# Patient Record
Sex: Male | Born: 1980 | Marital: Married | Smoking: Never smoker
Health system: Southern US, Community
[De-identification: ages and names within clinical notes are randomized; demographics above are authoritative.]

## PROBLEM LIST (undated history)

## (undated) DIAGNOSIS — E785 Hyperlipidemia, unspecified: Secondary | ICD-10-CM

## (undated) DIAGNOSIS — T7840XA Allergy, unspecified, initial encounter: Secondary | ICD-10-CM

## (undated) HISTORY — PX: FRACTURE SURGERY: SHX138

## (undated) HISTORY — DX: Allergy, unspecified, initial encounter: T78.40XA

## (undated) HISTORY — DX: Hyperlipidemia, unspecified: E78.5

---

## 2016-04-30 ENCOUNTER — Other Ambulatory Visit: Payer: Self-pay | Admitting: Family Medicine

## 2016-04-30 DIAGNOSIS — R17 Unspecified jaundice: Secondary | ICD-10-CM

## 2016-05-06 ENCOUNTER — Ambulatory Visit
Admission: RE | Admit: 2016-05-06 | Discharge: 2016-05-06 | Disposition: A | Discharge status: Home / Self Care | Payer: BLUE CROSS/BLUE SHIELD | Source: Ambulatory Visit | Attending: Family Medicine | Admitting: Family Medicine

## 2016-05-06 DIAGNOSIS — R17 Unspecified jaundice: Secondary | ICD-10-CM

## 2017-08-20 IMAGING — US US ABDOMEN LIMITED
1 series · 14 of 25 positions shown · non-contrast
Comparison: None.

CLINICAL DATA: Elevated bilirubin

EXAM:
US ABDOMEN LIMITED - RIGHT UPPER QUADRANT

[Series 1: us abdomen limited · 0.22mm/px · 14 of 50 slices shown]
[im 1/50]
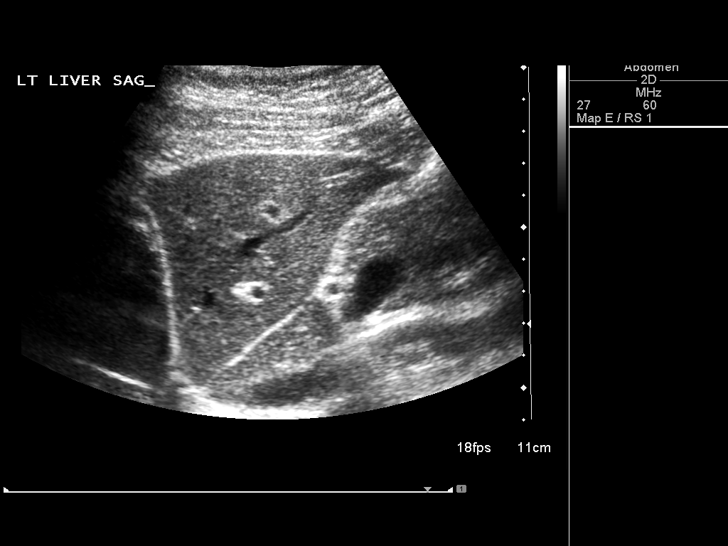
[im 5/50]
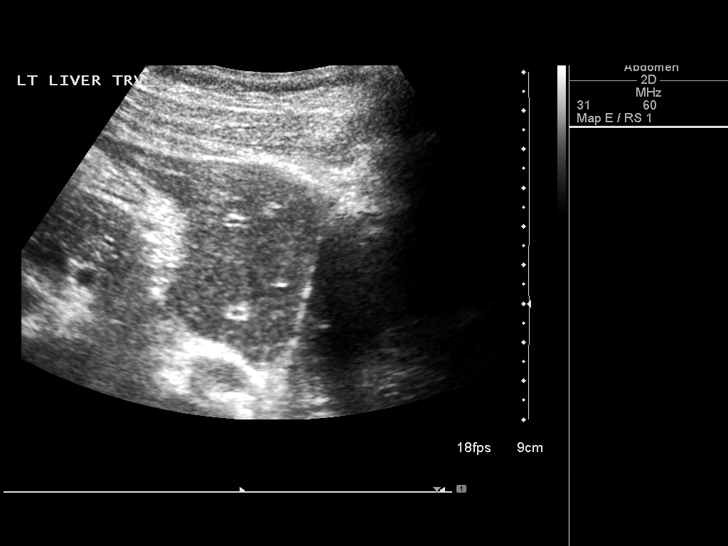
[im 9/50]
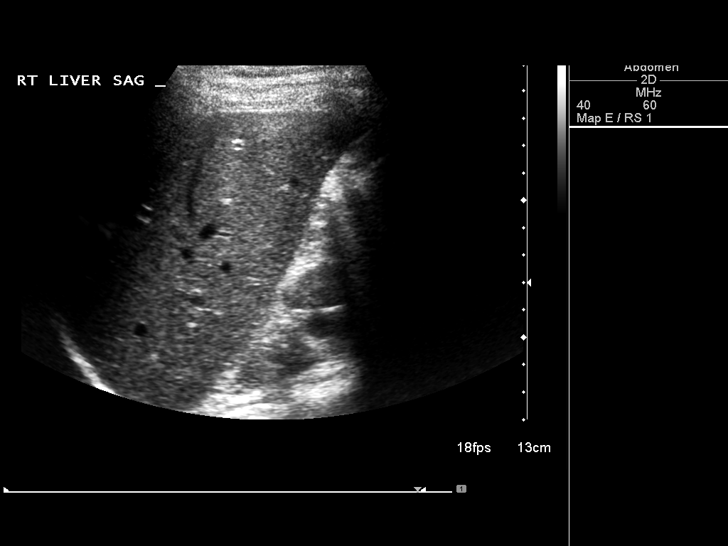
[im 13/50]
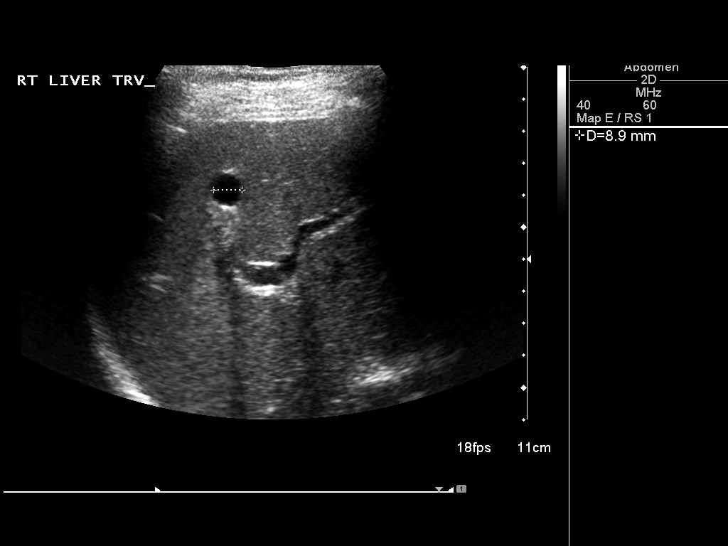
[im 17/50]
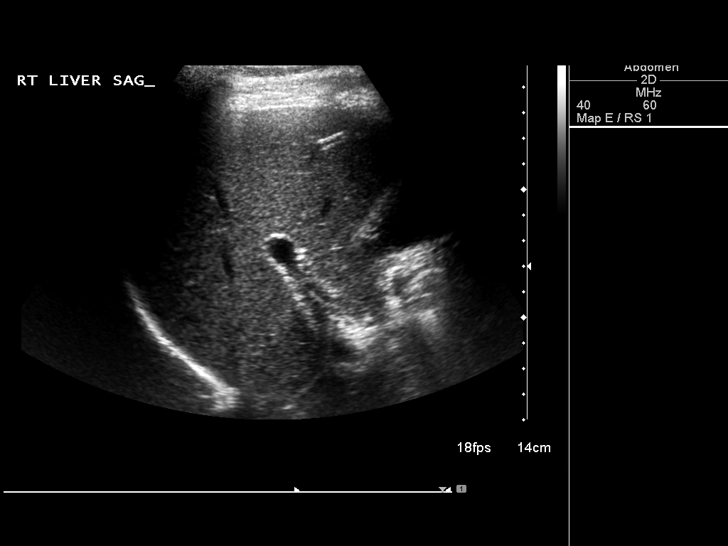
[im 19/50]
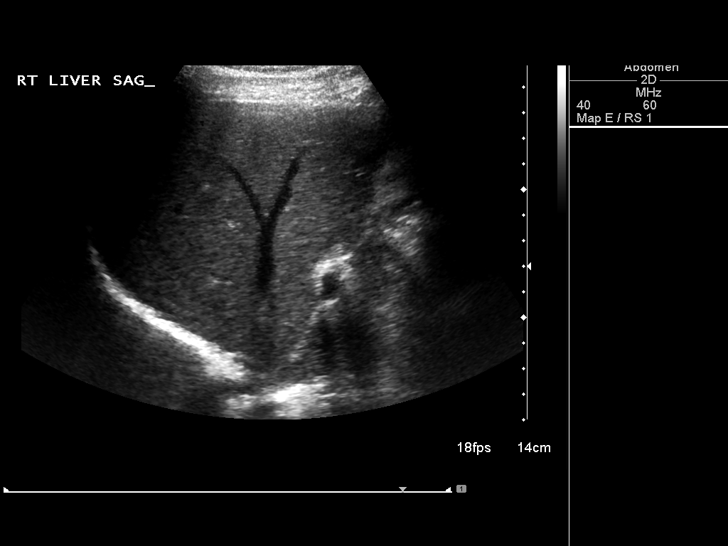
[im 23/50]
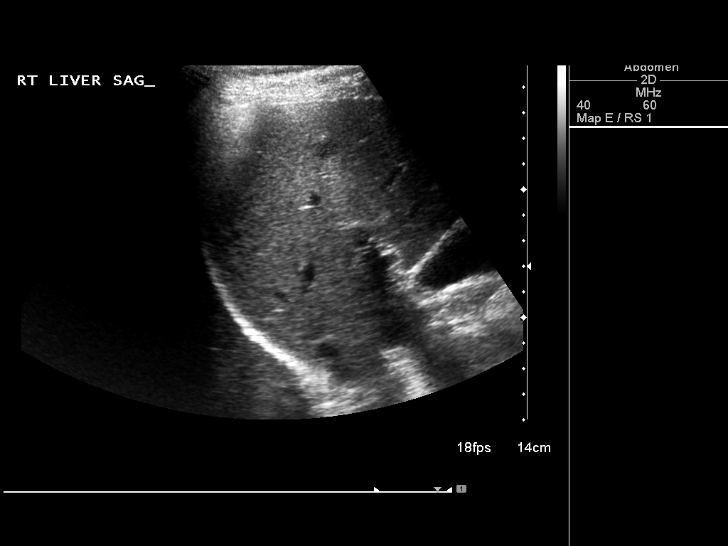
[im 27/50]
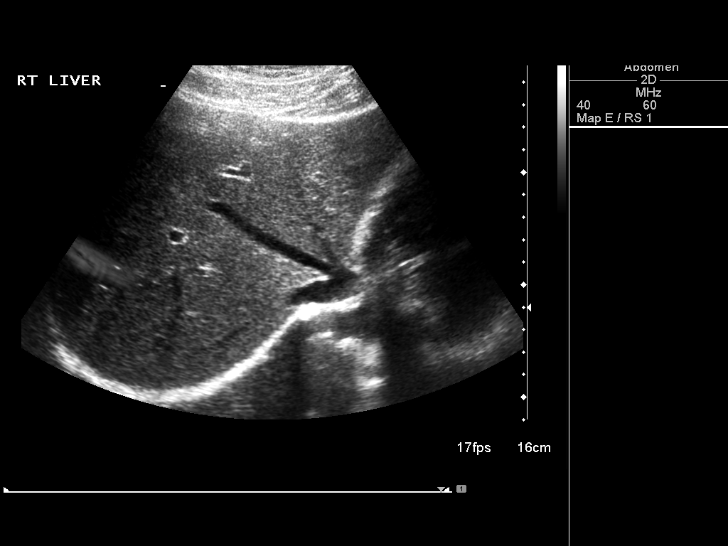
[im 31/50]
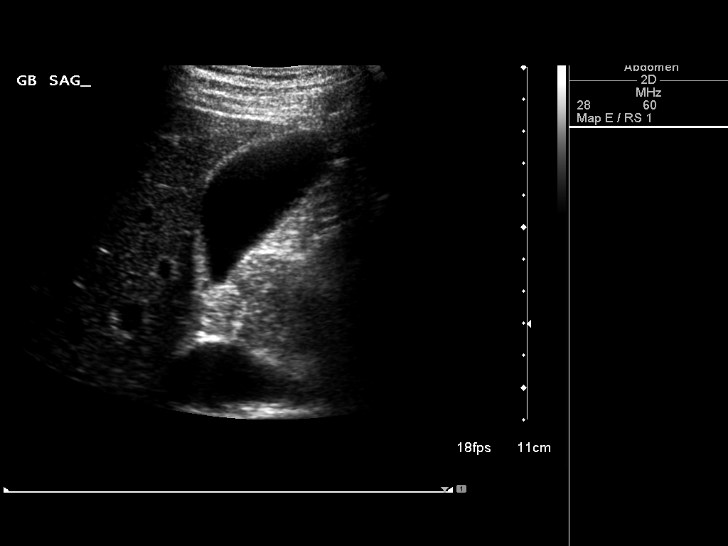
[im 33/50]
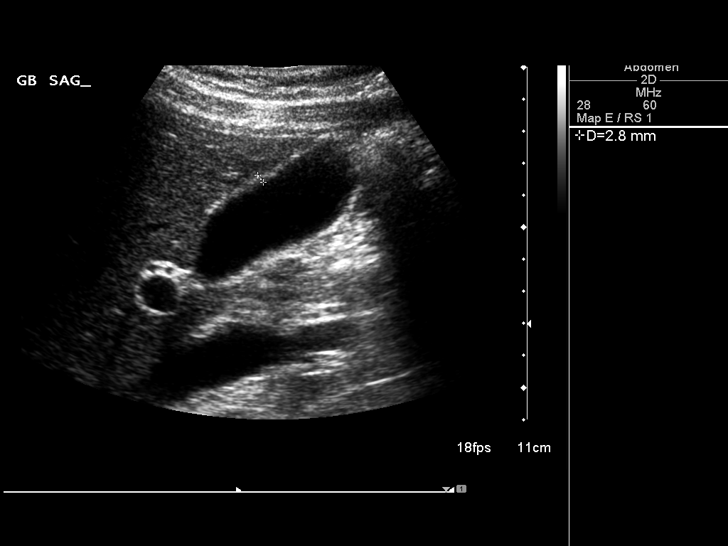
[im 37/50]
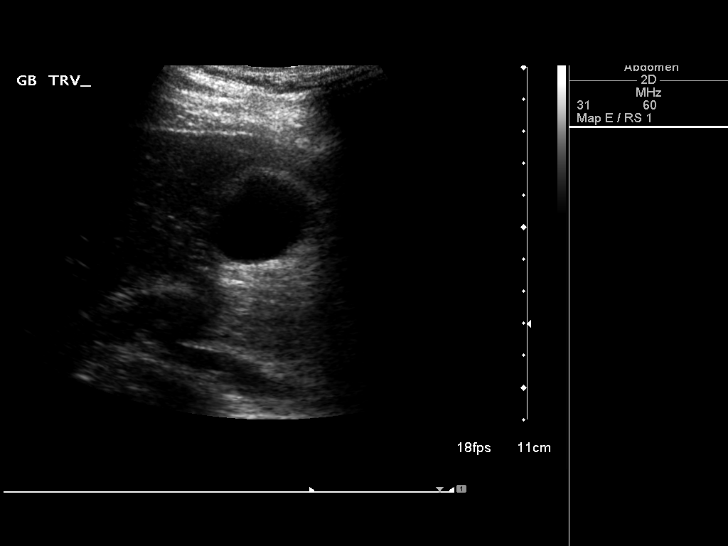
[im 41/50]
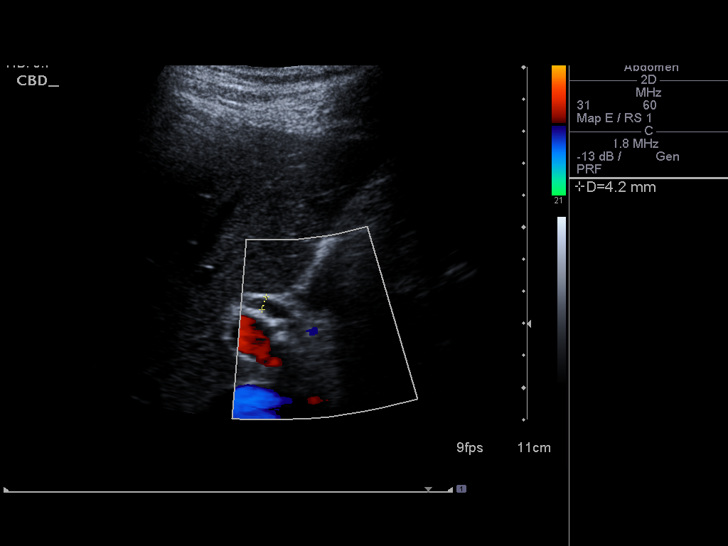
[im 45/50]
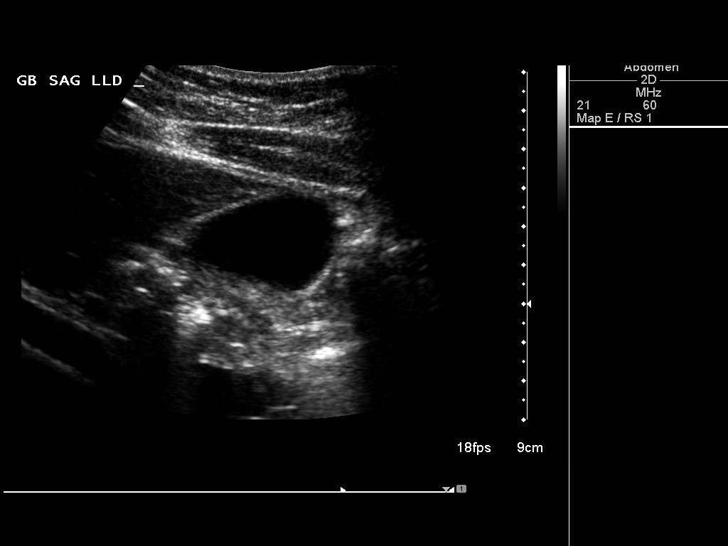
[im 50/50]
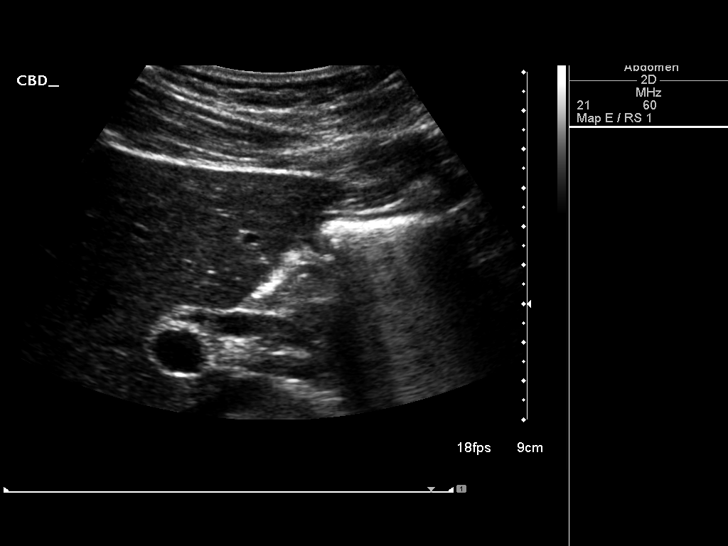

[14 of 25 positions shown; findings below may reference images not displayed]

FINDINGS: Gallbladder:

No gallstones or wall thickening visualized. No sonographic Murphy
sign noted by sonographer.

Common bile duct:

Diameter: 4.2 mm in diameter within normal limits

Liver:

No focal lesion identified. Within normal limits in parenchymal
echogenicity. There is a cyst in right hepatic lobe measures 1.1 x
1.1 cm.
IMPRESSION: No gallstones are noted within gallbladder. Normal CBD. Cyst in
right hepatic lobe measures 1.1 cm.

## 2023-01-19 ENCOUNTER — Other Ambulatory Visit: Payer: Self-pay

## 2023-01-19 ENCOUNTER — Emergency Department (HOSPITAL_BASED_OUTPATIENT_CLINIC_OR_DEPARTMENT_OTHER)
Admission: EM | Admit: 2023-01-19 | Discharge: 2023-01-19 | Disposition: A | Discharge status: Home / Self Care | Payer: 59 | Attending: Emergency Medicine | Admitting: Emergency Medicine

## 2023-01-19 ENCOUNTER — Encounter (HOSPITAL_BASED_OUTPATIENT_CLINIC_OR_DEPARTMENT_OTHER): Payer: Self-pay

## 2023-01-19 ENCOUNTER — Emergency Department (HOSPITAL_BASED_OUTPATIENT_CLINIC_OR_DEPARTMENT_OTHER): Payer: 59

## 2023-01-19 DIAGNOSIS — Y92331 Roller skating rink as the place of occurrence of the external cause: Secondary | ICD-10-CM | POA: Diagnosis not present

## 2023-01-19 DIAGNOSIS — S82851A Displaced trimalleolar fracture of right lower leg, initial encounter for closed fracture: Secondary | ICD-10-CM | POA: Insufficient documentation

## 2023-01-19 DIAGNOSIS — Y9351 Activity, roller skating (inline) and skateboarding: Secondary | ICD-10-CM | POA: Insufficient documentation

## 2023-01-19 DIAGNOSIS — S99911A Unspecified injury of right ankle, initial encounter: Secondary | ICD-10-CM | POA: Diagnosis present

## 2023-01-19 DIAGNOSIS — Z23 Encounter for immunization: Secondary | ICD-10-CM | POA: Insufficient documentation

## 2023-01-19 MED ORDER — ONDANSETRON HCL 4 MG/2ML IJ SOLN
4.0000 mg | Freq: Once | INTRAMUSCULAR | Status: AC
  Administered 2023-01-19: 4 mg via INTRAVENOUS
  Filled 2023-01-19: qty 2

## 2023-01-19 MED ORDER — LIDOCAINE-EPINEPHRINE 2 %-1:100000 IJ SOLN
20.0000 mL | Freq: Once | INTRAMUSCULAR | Status: DC
  Filled 2023-01-19: qty 20.4

## 2023-01-19 MED ORDER — TETANUS-DIPHTH-ACELL PERTUSSIS 5-2.5-18.5 LF-MCG/0.5 IM SUSY
0.5000 mL | PREFILLED_SYRINGE | Freq: Once | INTRAMUSCULAR | Status: AC
  Administered 2023-01-19: 0.5 mL via INTRAMUSCULAR
  Filled 2023-01-19: qty 0.5

## 2023-01-19 MED ORDER — LIDOCAINE-EPINEPHRINE (PF) 2 %-1:200000 IJ SOLN
INTRAMUSCULAR | Status: AC
  Administered 2023-01-19: 20 mL via INTRADERMAL
  Filled 2023-01-19: qty 20

## 2023-01-19 MED ORDER — LIDOCAINE-EPINEPHRINE (PF) 2 %-1:200000 IJ SOLN: 20.0000 mL | Freq: Once | INTRAMUSCULAR | Status: AC

## 2023-01-19 MED ORDER — IBUPROFEN 600 MG PO TABS: 600.0000 mg | ORAL_TABLET | Freq: Four times a day (QID) | ORAL | 0 refills | Status: DC | PRN

## 2023-01-19 MED ORDER — FENTANYL CITRATE PF 50 MCG/ML IJ SOSY
50.0000 ug | PREFILLED_SYRINGE | Freq: Once | INTRAMUSCULAR | Status: AC
  Administered 2023-01-19: 50 ug via INTRAVENOUS
  Filled 2023-01-19: qty 1

## 2023-01-19 NOTE — ED Provider Notes (Addendum)
EMERGENCY DEPARTMENT AT [facility] Provider Note   CSN: [id number] Arrival date & time: 01/19/23  1103     History  Chief Complaint  Patient presents with   Ankle Pain    Richard Klein is a 42 y.o. male who presents for right ankle injury.  Patient was skateboarding at a skate park when he landed with his right ankle.  He inverted the ankle and had immediate severe pain and disfiguration of the right ankle joint.  He patient states that it was hanging out toward the lateral side and he pushed it back in.  He states that it is loose and floppy and he has to hold it into place.  He denies numbness or tingling or previous injury to the area.  He did not hit his head or lose consciousness and takes no blood thinning medications.   Ankle Pain      Home Medications Prior to Admission medications   Not on File      Allergies    Patient has no known allergies.    Review of Systems   Review of Systems  Physical Exam Updated Vital Signs BP 125/73 (BP Location: Right Arm)   Pulse (!) 110   Temp 98.3 F (36.8 C) (Oral)   Resp 16   Ht 5\' 9"  (1.753 m)   Wt 70.3 kg   SpO2 100%   BMI 22.89 kg/m  Physical Exam Vitals and nursing note reviewed.  Constitutional:      General: He is not in acute distress.    Appearance: He is well-developed. He is not diaphoretic.  HENT:     Head: Normocephalic and atraumatic.  Eyes:     General: No scleral icterus.    Conjunctiva/sclera: Conjunctivae normal.  Cardiovascular:     Rate and Rhythm: Normal rate and regular rhythm.     Heart sounds: Normal heart sounds.  Pulmonary:     Effort: Pulmonary effort is normal. No respiratory distress.     Breath sounds: Normal breath sounds.  Abdominal:     Palpations: Abdomen is soft.     Tenderness: There is no abdominal tenderness.  Musculoskeletal:     Cervical back: Normal range of motion and neck supple.     Comments: R ankle deformity with mortis widening, dislocation  and swelling.NVI intact  Skin:    General: Skin is warm and dry.  Neurological:     Mental Status: He is alert.  Psychiatric:        Behavior: Behavior normal.     ED Results / Procedures / Treatments   Labs (all labs ordered are listed, but only abnormal results are displayed) Labs Reviewed - No data to display  EKG None  Radiology DG Ankle Complete Right  Result Date: 01/19/2023 CLINICAL DATA:  Injury EXAM: RIGHT ANKLE - COMPLETE 3+ VIEW COMPARISON:  None Available. FINDINGS: There is a displaced fracture of the posterolateral distal fibula lateral malleolus. Questionable small fracture of the distal posterior tibia although this may be part of the fibular fracture. There is significant widening of the medial tibiotalar clear space measuring 1.2 cm. Regional soft tissue swelling. IMPRESSION: 1. Displaced fracture of the posterolateral distal fibula/lateral malleolus. 2. Questionable small fracture of the distal posterior tibia although this may be part of the fibular fracture. 3. Significant widening of the medial tibiotalar clear space. Electronically Signed   By: Emmaline Kluver M.D.   On: 01/19/2023 11:34    Procedures .Nerve Block  Date/Time: 01/19/2023  12:55 PM  Performed by: Arthor Captain, PA-C Authorized by: Arthor Captain, PA-C   Consent:    Consent obtained:  Verbal   Consent given by:  Patient   Risks discussed:  Bleeding, pain and allergic reaction   Alternatives discussed:  No treatment Universal protocol:    Patient identity confirmed:  Verbally with patient Location:    Body area:  Lower extremity   Lower extremity nerve:  Superficial peroneal (full ankle block - superficial peroneal, sural, deep peroneal, saphenous and posterior tibial) Pre-procedure details:    Skin preparation:  Chlorhexidine Procedure details:    Block needle gauge:  25 G   Anesthetic injected:  Lidocaine 1% WITH epi   Injection procedure:  Anatomic landmarks identified, negative  aspiration for blood, introduced needle and incremental injection   Paresthesia:  None Post-procedure details:    Dressing:  None   Outcome:  Anesthesia achieved   Procedure completion:  Tolerated well, no immediate complications Reduction of fracture  Date/Time: 01/19/2023 1:00 PM  Performed by: Arthor Captain, PA-C Authorized by: Arthor Captain, PA-C  Consent given by: patient Patient identity confirmed: verbally with patient Time out: Immediately prior to procedure a "time out" was called to verify the correct patient, procedure, equipment, support staff and site/side marked as required. Preparation: Patient was prepped and draped in the usual sterile fashion. Local anesthesia used: yes Anesthesia: hematoma block  Anesthesia: Local anesthesia used: yes Local Anesthetic: lidocaine 1% with epinephrine Anesthetic total: 4 mL Patient tolerance: patient tolerated the procedure well with no immediate complications   .Splint Application  Date/Time: 01/19/2023 1:01 PM  Performed by: Arthor Captain, PA-C Authorized by: Arthor Captain, PA-C   Consent:    Consent obtained:  Verbal   Consent given by:  Patient   Risks discussed:  Numbness, discoloration, pain and swelling   Alternatives discussed:  No treatment Universal protocol:    Patient identity confirmed:  Verbally with patient Pre-procedure details:    Distal neurologic exam:  Normal   Distal perfusion: distal pulses strong and brisk capillary refill   Procedure details:    Location:  Ankle   Ankle location:  R ankle   Splint type:  Short leg   Supplies:  Cotton padding, elastic bandage and fiberglass   Attestation: Splint applied and adjusted personally by me   Post-procedure details:    Distal neurologic exam:  Normal   Distal perfusion: brisk capillary refill and unchanged     Procedure completion:  Tolerated well, no immediate complications   Post-procedure imaging: reviewed       Medications Ordered in  ED Medications  fentaNYL (SUBLIMAZE) injection 50 mcg (50 mcg Intravenous Given 01/19/23 1131)  ondansetron (ZOFRAN) injection 4 mg (4 mg Intravenous Given 01/19/23 1131)    ED Course/ Medical Decision Making/ A&P Clinical Course as of 01/19/23 1815  Wed Jan 19, 2023  1338 DG Ankle Complete Right [AH]  1338 DG Ankle 2 Views Right Visualized and interpreted both pre and postreduction films.  Initial x-ray showed significant lateral and posterior displacement with try malleolar fracture.  Postreduction shows significant improvement in the alignment of the ankle.  I reassessed the patient he remains neurovascularly intact. [AH]  1339 I discussed the case with Dr. Shon Baton who is on-call for orthopedics who recommends a CT scan.  When the CT has returned he will touch base with his foot and ankle colleagues and give me the follow-up plan. [AH]    Clinical Course User Index [AH] Arthor Captain, PA-C  Medical Decision Making Amount and/or Complexity of Data Reviewed Radiology: ordered and independent interpretation performed. Decision-making details documented in ED Course.  Risk Prescription drug management.   42 year old male here for fracture dislocation of the right ankle. I was able to achieve anesthesia with hematoma and distal ankle block as noted in the procedure tab.  Patient tolerated the procedure well and I was able to personally apply the posterior and stirrup splint with good alignment and reduction of the fracture dislocation.  Case discussed with Dr. Venita Lick.  Patient has an appointment at 10:30 AM on Monday with Dr. Victorino Dike for follow-up.  I suspect he will likely need ORIF but we will leave this up to the orthopedic physicians.  I have discussed this with the patient.  He is aware to remain nonweightbearing on the leg.  Crutches and anti-inflammatories with RICE protocol at discharge.  Discussed outpatient follow-up and return  precautions.        Final Clinical Impression(s) / ED Diagnoses Final diagnoses:  None    Rx / DC Orders ED Discharge Orders     None         Arthor Captain, PA-C 01/19/23 1819    Virgina Norfolk, DO 01/21/23 2118    Arthor Captain, PA-C 01/27/23 1159    Curatolo, Adam, DO 02/02/23 1120

## 2023-01-19 NOTE — ED Triage Notes (Signed)
Patient presents to ED via POV from skate park. Patient was stake boarding when he landed wrong, causing injury to his right ankle. Patient arrives holding ankle. Reports "If I dont hold it, its floppy". Denies fall. Denies hitting head or being on blood thinners. Denies numbness/tingling. Able to move toes.

## 2023-01-19 NOTE — Discharge Instructions (Addendum)
Follow up with Dr. Victorino Dike - call today to confirm appointment time on Monday. Splint Care: Do not Bear weight on the splint or the ankle Use your crutches at all times when ambulating. Do not stick anything inside the cast or splint to scratch your skin. Doing that increases your risk of infection. Check the skin around the cast or splint every day. Tell your provider about any concerns. You may put lotion on dry skin around the edges of the cast or splint. Do not put lotion on the skin underneath the cast or splint. Keep the cast or splint clean. If the cast or splint is not waterproof: Do not let it get wet. Cover it with a watertight covering when you take a bath or shower. Contact a health care provider if: You have a fever. Your pain does not improve with your pain medicine. Get help right away if: You have a severe increase in pain or swelling. Your toes tingle or become blue, numb, or very cold. You lose feeling in your leg, foot, or ankle.

## 2023-01-19 NOTE — ED Notes (Signed)
Discharge instructions reviewed with patient. Patient verbalizes understanding, no further questions at this time. Medications/prescriptions and follow up information provided. No acute distress noted at time of departure.  

## 2023-01-21 ENCOUNTER — Ambulatory Visit: Payer: 59 | Admitting: Family Medicine

## 2023-01-24 DIAGNOSIS — S93429A Sprain of deltoid ligament of unspecified ankle, initial encounter: Secondary | ICD-10-CM | POA: Insufficient documentation

## 2023-01-24 DIAGNOSIS — S82841A Displaced bimalleolar fracture of right lower leg, initial encounter for closed fracture: Secondary | ICD-10-CM | POA: Insufficient documentation

## 2023-02-22 ENCOUNTER — Encounter: Payer: Self-pay | Admitting: Family Medicine

## 2023-02-22 ENCOUNTER — Ambulatory Visit: Payer: 59 | Admitting: Family Medicine

## 2023-02-22 VITALS — BP 118/72 | HR 93 | Temp 98.5°F | Ht 68.0 in | Wt 156.8 lb

## 2023-02-22 DIAGNOSIS — E782 Mixed hyperlipidemia: Secondary | ICD-10-CM

## 2023-02-22 DIAGNOSIS — J309 Allergic rhinitis, unspecified: Secondary | ICD-10-CM | POA: Insufficient documentation

## 2023-02-22 DIAGNOSIS — S82841D Displaced bimalleolar fracture of right lower leg, subsequent encounter for closed fracture with routine healing: Secondary | ICD-10-CM | POA: Diagnosis not present

## 2023-02-22 DIAGNOSIS — R17 Unspecified jaundice: Secondary | ICD-10-CM

## 2023-02-22 DIAGNOSIS — Z131 Encounter for screening for diabetes mellitus: Secondary | ICD-10-CM

## 2023-02-22 DIAGNOSIS — E785 Hyperlipidemia, unspecified: Secondary | ICD-10-CM | POA: Insufficient documentation

## 2023-02-22 DIAGNOSIS — Z1159 Encounter for screening for other viral diseases: Secondary | ICD-10-CM

## 2023-02-22 NOTE — Progress Notes (Signed)
[facility] [address] [place] [address] Dept: [phone] Dept Fax: [phone]  New Patient Office Visit  Subjective:    Patient ID: Richard Klein, male    DOB: October 19, 1980, 42 y.o..   MRN: [id number]  Chief Complaint  Patient presents with   Establish Care    NP-establish care.      History of Present Illness:  Patient is in today to establish care. Mr. Richard Klein was born [place] His parents were originally from [place]. The family moved to [place] in 1991. Mr. Richard Klein attended college at the [employer] in [place], [place], Glass blower/designer in Dance movement psychotherapist. 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 He has been married for 16 years. He has a daughter (8). He denies use of tobacco or drugs. He rarely drinks alcohol.  [name] suffered a bimalleolar fracture on 7/3. He had a fall while skateboarding. He underwent ORIF and is currently in a cast. It is anticipated that he will have the cast off in about 3 weeks.  [name] has a history of hyperlipidemia. He is managed on rosuvastatin 5 mg daily.  Past Medical History: Patient Active Problem List   Diagnosis Date Noted   Hyperlipidemia 02/22/2023   Allergic rhinitis 02/22/2023   Closed bimalleolar fracture of right ankle 01/24/2023   Sprain of deltoid ligament of ankle 01/24/2023   Past Surgical History:  Procedure Laterality Date   FRACTURE SURGERY  02/25/2023   ORIF for ankle   Family History  Problem Relation Age of Onset   Diabetes Father    Hyperlipidemia Father    ADD / ADHD Daughter    Anxiety disorder Daughter    Outpatient Medications Prior to Visit  Medication Sig Dispense Refill   aspirin EC 81 MG tablet Take 81 mg by mouth once.     rosuvastatin (CRESTOR) 10 MG tablet Take 10 mg by mouth at bedtime.     ibuprofen (ADVIL) 600 MG tablet Take 1 tablet (600 mg total) by mouth every 6  (six) hours as needed. 30 tablet 0   No facility-administered medications prior to visit.   No Known Allergies Objective:   Today's Vitals   02/22/23 1503  BP: 118/72  Pulse: 93  Temp: 98.5 F (36.9 C)  TempSrc: Temporal  SpO2: 97%  Weight: 156 lb 12.8 oz (71.1 kg)  Height: 5\' 8"  (1.727 m)   Body mass index is 23.84 kg/m.   General: Well developed, well nourished. No acute distress. Psych: Alert and oriented. Normal mood and affect.  Health Maintenance Due  Topic Date Due   HIV Screening  Never done   Hepatitis C Screening  Never done      Assessment & Plan:   Problem List Items Addressed This Visit       Musculoskeletal and Integument   Closed bimalleolar fracture of right ankle - Primary    Continue to follow with orthopedics.        Other   Elevated bilirubin    By history has had a chronically elevated bilirubin. This likely represent Gilbert syndrome. I will check this to confirm.      Relevant Orders   Comprehensive metabolic panel   Hyperlipidemia    Continue rosuvastatin 5 mg daily. I will have him return for fasting lipids.      Relevant Medications   aspirin EC 81 MG tablet   Other Relevant Orders   Comprehensive metabolic  panel   Lipid panel   Other Visit Diagnoses     Screening for diabetes mellitus (DM)       Relevant Orders   Comprehensive metabolic panel   Encounter for hepatitis C screening test for low risk patient       Relevant Orders   HCV Ab w Reflex to Quant PCR       Return for Annual preventative care.   Loyola Mast, MD

## 2023-02-22 NOTE — Assessment & Plan Note (Signed)
Continue to follow with orthopedics

## 2023-02-22 NOTE — Assessment & Plan Note (Signed)
Continue rosuvastatin 5 mg daily. I will have him return for fasting lipids.

## 2023-02-22 NOTE — Assessment & Plan Note (Signed)
By history has had a chronically elevated bilirubin. This likely represent Gilbert syndrome. I will check this to confirm.

## 2023-03-03 ENCOUNTER — Encounter (INDEPENDENT_AMBULATORY_CARE_PROVIDER_SITE_OTHER): Payer: Self-pay

## 2023-03-09 ENCOUNTER — Other Ambulatory Visit: Payer: Self-pay | Admitting: Oncology

## 2023-03-09 DIAGNOSIS — Z006 Encounter for examination for normal comparison and control in clinical research program: Secondary | ICD-10-CM

## 2023-03-30 ENCOUNTER — Other Ambulatory Visit (INDEPENDENT_AMBULATORY_CARE_PROVIDER_SITE_OTHER): Payer: 59

## 2023-03-30 ENCOUNTER — Ambulatory Visit (INDEPENDENT_AMBULATORY_CARE_PROVIDER_SITE_OTHER): Payer: 59

## 2023-03-30 ENCOUNTER — Encounter: Payer: Self-pay | Admitting: Family Medicine

## 2023-03-30 DIAGNOSIS — Z131 Encounter for screening for diabetes mellitus: Secondary | ICD-10-CM | POA: Diagnosis not present

## 2023-03-30 DIAGNOSIS — R17 Unspecified jaundice: Secondary | ICD-10-CM | POA: Diagnosis not present

## 2023-03-30 DIAGNOSIS — Z23 Encounter for immunization: Secondary | ICD-10-CM

## 2023-03-30 DIAGNOSIS — Z1159 Encounter for screening for other viral diseases: Secondary | ICD-10-CM

## 2023-03-30 DIAGNOSIS — E782 Mixed hyperlipidemia: Secondary | ICD-10-CM

## 2023-03-30 LAB — COMPREHENSIVE METABOLIC PANEL
ALT: 25 U/L (ref 0–53)
AST: 20 U/L (ref 0–37)
Albumin: 5 g/dL (ref 3.5–5.2)
Alkaline Phosphatase: 65 U/L (ref 39–117)
BUN: 16 mg/dL (ref 6–23)
CO2: 29 meq/L (ref 19–32)
Calcium: 10.6 mg/dL — ABNORMAL HIGH (ref 8.4–10.5)
Chloride: 102 meq/L (ref 96–112)
Creatinine, Ser: 1 mg/dL (ref 0.40–1.50)
GFR: 92.44 mL/min (ref >60.00)
Glucose, Bld: 93 mg/dL (ref 70–99)
Potassium: 4.6 meq/L (ref 3.5–5.1)
Sodium: 142 meq/L (ref 135–145)
Total Bilirubin: 2.4 mg/dL — ABNORMAL HIGH (ref 0.2–1.2)
Total Protein: 7.8 g/dL (ref 6.0–8.3)

## 2023-03-30 LAB — LIPID PANEL
Cholesterol: 150 mg/dL (ref 0–200)
HDL: 61.2 mg/dL (ref >39.00)
LDL Cholesterol: 63 mg/dL (ref 0–99)
NonHDL: 88.58
Total CHOL/HDL Ratio: 2
Triglycerides: 128 mg/dL (ref 0.0–149.0)
VLDL: 25.6 mg/dL (ref 0.0–40.0)

## 2023-03-30 NOTE — Progress Notes (Signed)
Pt here for lab appt and requesting injection of Flulaval, given IM left deltoid by Pamala Hurry [place]. Patient instructed to remain in clinic for 20 minutes afterwards, and to report any adverse reaction to me immediately. Pt tolerated well.

## 2023-03-31 LAB — HCV AB W REFLEX TO QUANT PCR: HCV Ab: NONREACTIVE

## 2023-03-31 LAB — HCV INTERPRETATION

## 2023-04-04 ENCOUNTER — Encounter: Payer: Self-pay | Admitting: Family Medicine

## 2023-04-04 ENCOUNTER — Ambulatory Visit (INDEPENDENT_AMBULATORY_CARE_PROVIDER_SITE_OTHER): Payer: 59 | Admitting: Family Medicine

## 2023-04-04 VITALS — BP 122/68 | HR 77 | Temp 98.7°F | Ht 68.0 in | Wt 152.0 lb

## 2023-04-04 DIAGNOSIS — E782 Mixed hyperlipidemia: Secondary | ICD-10-CM

## 2023-04-04 DIAGNOSIS — S82841D Displaced bimalleolar fracture of right lower leg, subsequent encounter for closed fracture with routine healing: Secondary | ICD-10-CM

## 2023-04-04 DIAGNOSIS — Z0000 Encounter for general adult medical examination without abnormal findings: Secondary | ICD-10-CM | POA: Diagnosis not present

## 2023-04-04 NOTE — Assessment & Plan Note (Signed)
Lipids are at goal. Continue rosuvastatin 5 mg daily.

## 2023-04-04 NOTE — Assessment & Plan Note (Signed)
By history has had a chronically elevated bilirubin. This likely represent Gilbert syndrome.

## 2023-04-04 NOTE — Progress Notes (Signed)
[facility] [address] [place] [address] Dept: [phone] Dept Fax: [phone]  Annual Physical Visit  Subjective:    Patient ID: Richard Klein, male    DOB: 20-Apr-1981, 42 y.o..   MRN: [id number]  Chief Complaint  Patient presents with   Annual Exam    CPE/labs   History of Present Illness:  Patient is in today for an annual physical/preventative visit.  Review of Systems  Constitutional:  Negative for chills, diaphoresis, fever, malaise/fatigue and weight loss.  HENT:  Positive for congestion. Negative for ear pain, hearing loss, sinus pain, sore throat and tinnitus.        Mild seasonal allergies.  Eyes:  Negative for blurred vision, pain, discharge and redness.  Respiratory:  Negative for cough, shortness of breath and wheezing.   Cardiovascular:  Negative for chest pain and palpitations.  Gastrointestinal:  Negative for abdominal pain, constipation, diarrhea, heartburn, nausea and vomiting.  Musculoskeletal:  Positive for joint pain. Negative for back pain and myalgias.       Continued recovery from right bimalleolar fracture. He is out of his cast now and in a walking boot. Plans for PT soon.  Skin:  Negative for itching and rash.  Psychiatric/Behavioral:  Negative for depression. The patient is not nervous/anxious.    Past Medical History: Patient Active Problem List   Diagnosis Date Noted   Hyperlipidemia 02/22/2023   Allergic rhinitis 02/22/2023   Gilbert's disease 02/22/2023   Closed bimalleolar fracture of right ankle 01/24/2023   Sprain of deltoid ligament of ankle 01/24/2023   Past Surgical History:  Procedure Laterality Date   FRACTURE SURGERY  02/25/2023   ORIF for ankle   Family History  Problem Relation Age of Onset   Diabetes Father    Hyperlipidemia Father    ADD / ADHD Daughter    Anxiety disorder Daughter    Diabetes Maternal Uncle    Cancer Maternal Grandmother        Breast (?)    Outpatient Medications Prior to Visit  Medication Sig Dispense Refill   rosuvastatin (CRESTOR) 10 MG tablet Take 10 mg by mouth at bedtime.     aspirin EC 81 MG tablet Take 81 mg by mouth once.     No facility-administered medications prior to visit.   No Known Allergies Objective:   Today's Vitals   04/04/23 1010  BP: 122/68  Pulse: 77  Temp: 98.7 F (37.1 C)  TempSrc: Temporal  SpO2: 100%  Weight: 152 lb (68.9 kg)  Height: 5\' 8"  (1.727 m)   Body mass index is 23.11 kg/m.   General: Well developed, well nourished. No acute distress. HEENT: Normocephalic, non-traumatic. PERRL, EOMI. Conjunctiva clear. External ears normal. Fundiscopic exam shows   normal disc and vasculature.EAC and TMs normal bilaterally. Nose clear without congestion or rhinorrhea. Mucous   membranes moist. Mild cobblestoning of posterior oropharynx. Good dentition. Neck: Supple. No lymphadenopathy. No thyromegaly. Lungs: Clear to auscultation bilaterally. No wheezing, rales or rhonchi. CV: RRR without murmurs or rubs. Pulses 2+ bilaterally. Abdomen: Soft, non-tender. Bowel sounds positive, normal pitch and frequency. No hepatosplenomegaly. No rebound or   guarding. Extremities: Full ROM. No joint swelling or tenderness. No edema noted. Skin: Warm and dry. No rashes. Psych: Alert and oriented. Normal mood and affect.  Health Maintenance Due  Topic Date Due   HIV Screening  Never done   Lab Results:    Latest Ref Rng & Units 03/30/2023    8:14 AM  CMP  Glucose 70 - 99 mg/dL 93   BUN 6 - 23 mg/dL 16   Creatinine 1.61 - 1.50 mg/dL 0.96   Sodium 045 - 409 mEq/L 142   Potassium 3.5 - 5.1 mEq/L 4.6   Chloride 96 - 112 mEq/L 102   CO2 19 - 32 mEq/L 29   Calcium 8.4 - 10.5 mg/dL 81.1   Total Protein 6.0 - 8.3 g/dL 7.8   Total Bilirubin 0.2 - 1.2 mg/dL 2.4   Alkaline Phos 39 - 117 U/L 65   AST 0 - 37 U/L 20   ALT 0 - 53 U/L 25    Lab Results  Component Value Date   CHOL 150 03/30/2023   HDL  61.20 03/30/2023   LDLCALC 63 03/30/2023   TRIG 128.0 03/30/2023   CHOLHDL 2 03/30/2023   Assessment & Plan:   Problem List Items Addressed This Visit       Musculoskeletal and Integument   Closed bimalleolar fracture of right ankle    Continue to follow with orthopedics.        Other   Gilbert's disease    By history has had a chronically elevated bilirubin. This likely represent Gilbert syndrome.      Hyperlipidemia    Lipids are at goal. Continue rosuvastatin 5 mg daily.      Other Visit Diagnoses     Annual physical exam    -  Primary   Overall excellent health. Discussed recommended screenings and immunizations.       Return in about 1 year (around 04/03/2024) for Annual preventative care.   Loyola Mast, MD

## 2023-04-04 NOTE — Assessment & Plan Note (Signed)
Continue to follow with orthopedics

## 2023-04-05 ENCOUNTER — Encounter: Payer: Self-pay | Admitting: Family Medicine

## 2023-06-21 ENCOUNTER — Ambulatory Visit: Payer: 59 | Admitting: Family Medicine

## 2023-06-21 ENCOUNTER — Encounter: Payer: Self-pay | Admitting: Family Medicine

## 2023-06-21 VITALS — BP 120/74 | HR 80 | Temp 98.3°F | Ht 68.0 in | Wt 160.6 lb

## 2023-06-21 DIAGNOSIS — Z8781 Personal history of (healed) traumatic fracture: Secondary | ICD-10-CM

## 2023-06-21 DIAGNOSIS — L239 Allergic contact dermatitis, unspecified cause: Secondary | ICD-10-CM | POA: Insufficient documentation

## 2023-06-21 DIAGNOSIS — Z9889 Other specified postprocedural states: Secondary | ICD-10-CM | POA: Diagnosis not present

## 2023-06-21 LAB — CBC
HCT: 45.9 % (ref 39.0–52.0)
Hemoglobin: 15.4 g/dL (ref 13.0–17.0)
MCHC: 33.5 g/dL (ref 30.0–36.0)
MCV: 91.1 fL (ref 78.0–100.0)
Platelets: 206 10*3/uL (ref 150.0–400.0)
RBC: 5.04 Mil/uL (ref 4.22–5.81)
RDW: 13.6 % (ref 11.5–15.5)
WBC: 7.7 10*3/uL (ref 4.0–10.5)

## 2023-06-21 LAB — SEDIMENTATION RATE: Sed Rate: 11 mm/h (ref 0–15)

## 2023-06-21 LAB — C-REACTIVE PROTEIN: CRP: <1 mg/dL (ref 0.5–20.0)

## 2023-06-21 MED ORDER — MOMETASONE FUROATE 0.1 % EX CREA
TOPICAL_CREAM | CUTANEOUS | 1 refills | Status: AC
Start: 2023-06-21 — End: ?

## 2023-06-21 NOTE — Patient Instructions (Signed)
Clean area of rash over lateral right ankle with a saline wound spray daily. Pat dry and apply a thin coat of mometasone (Elocon) cream. Cover with a non-adherent pad Do not use Neosporin, hydrogen peroxide, Hibiclens, or other topicals on this area. Take diphenhydramine (Benadryl) as needed for itching.

## 2023-06-21 NOTE — Assessment & Plan Note (Signed)
I do not see clear sign of infection. I suspect that Mr. Vanegas is having an allergic reaction to the dressings, tapes, and/or topicals (such as the Neosporin) he has been using. I recommend he: Clean area of rash over lateral right ankle with a saline wound spray daily. Pat dry and apply a thin coat of mometasone (Elocon) cream. Cover with a non-adherent pad Do not use Neosporin, hydrogen peroxide, Hibiclens, or other topicals on this area. Take diphenhydramine (Benadryl) as needed for itching.  I will check a CBC, ESR, and CRP and check a soft-tissue ultrasound of the ankle to rule-out infection. I will reassess his wound in 2 weeks.

## 2023-06-21 NOTE — Progress Notes (Signed)
[facility] [address] [place] [address] Dept: [phone] Dept Fax: [phone]  Office Visit  Subjective:    Patient ID: Richard Klein, male    DOB: [birth year], 42 y.o..   MRN: [id number]  Chief Complaint  Patient presents with   Ankle Injury    Wound not healing on RT ankle.     History of Present Illness:  Patient is in today for evaluation of a non-healing surgical wound. Mr. Ramus underwent ORIF of a trimalleolar fracture of his right ankle in July. He had a cast in place initially. He has noted delayed healing of the wound since that time. He had been cleaning this with alcohol at one point, but has stopped this. He was also applying Neosporin to the site. He start applying a combination dressing but developed small fluid-filled bumps and itching. In fact, he notes some more diffuse itching issues since that time. He stopped using the Neomycin 3 days ago. He has found the itching to have improved some last night. He had seen the PA with his surgeon a few weeks ago who was concerned that the serous drainage he has had might represent infection. he did prescribe a 10-day course of cephalexin.  Past Medical History: Patient Active Problem List   Diagnosis Date Noted   Hyperlipidemia 02/22/2023   Allergic rhinitis 02/22/2023   Gilbert's disease 02/22/2023   Closed bimalleolar fracture of right ankle 01/24/2023   Sprain of deltoid ligament of ankle 01/24/2023   Past Surgical History:  Procedure Laterality Date   FRACTURE SURGERY  02/25/2023   ORIF for ankle   Family History  Problem Relation Age of Onset   Diabetes Father    Hyperlipidemia Father    ADD / ADHD Daughter    Anxiety disorder Daughter    Diabetes Maternal Uncle    Cancer Maternal Grandmother        Breast (?)   Outpatient Medications Prior to Visit  Medication Sig Dispense Refill   cephALEXin (KEFLEX) 500 MG capsule Take 500 mg by mouth 4  (four) times daily.     rosuvastatin (CRESTOR) 10 MG tablet Take 10 mg by mouth at bedtime.     No facility-administered medications prior to visit.   No Known Allergies   Objective:   Today's Vitals   06/21/23 1049  BP: 120/74  Pulse: 80  Temp: 98.3 F (36.8 C)  TempSrc: Temporal  SpO2: 100%  Weight: 160 lb 9.6 oz (72.8 kg)  Height: 5\' 8"  (1.727 m)   Body mass index is 24.42 kg/m.   General: Well developed, well nourished. No acute distress. Extremities: The surgical wounds on either aspect of the right ankle are healed well. However, there is an ara of   dermatitis overlying the upper portion of the lateral surgical wound. There is a bogginess to this area and there is   some mild serous drainage noted. The area of dermatitis is patterned to the area that would underlay a dressing  pad.  Psych: Alert and oriented. Normal mood and affect.  Health Maintenance Due  Topic Date Due   HIV Screening  Never done     Assessment & Plan:   Problem List Items Addressed This Visit       Musculoskeletal and Integument   Allergic dermatitis - Primary    I do not see clear sign of infection. I suspect that Mr. Icaza is having an allergic reaction to the dressings, tapes, and/or topicals (such  as the Neosporin) he has been using. I recommend he: Clean area of rash over lateral right ankle with a saline wound spray daily. Pat dry and apply a thin coat of mometasone (Elocon) cream. Cover with a non-adherent pad Do not use Neosporin, hydrogen peroxide, Hibiclens, or other topicals on this area. Take diphenhydramine (Benadryl) as needed for itching.  I will check a CBC, ESR, and CRP and check a soft-tissue ultrasound of the ankle to rule-out infection. I will reassess his wound in 2 weeks.      Relevant Medications   mometasone (ELOCON) 0.1 % cream   Other Relevant Orders   CBC   Sedimentation rate   C-reactive protein   [place] RT LOWER EXTREM LTD SOFT TISSUE NON VASCULAR      Other   Status post open reduction with internal fixation (ORIF) of fracture of ankle   Relevant Orders   [place] RT LOWER EXTREM LTD SOFT TISSUE NON VASCULAR    Return in about 2 years (around 06/20/2025) for Reassessment.   Loyola Mast, MD

## 2023-06-24 ENCOUNTER — Telehealth: Payer: Self-pay | Admitting: Family Medicine

## 2023-06-24 ENCOUNTER — Ambulatory Visit (HOSPITAL_BASED_OUTPATIENT_CLINIC_OR_DEPARTMENT_OTHER)
Admission: RE | Admit: 2023-06-24 | Discharge: 2023-06-24 | Disposition: A | Discharge status: Home / Self Care | Payer: 59 | Source: Ambulatory Visit | Attending: Family Medicine

## 2023-06-24 DIAGNOSIS — Z9889 Other specified postprocedural states: Secondary | ICD-10-CM | POA: Insufficient documentation

## 2023-06-24 DIAGNOSIS — Z8781 Personal history of (healed) traumatic fracture: Secondary | ICD-10-CM | POA: Diagnosis present

## 2023-06-24 DIAGNOSIS — L239 Allergic contact dermatitis, unspecified cause: Secondary | ICD-10-CM | POA: Insufficient documentation

## 2023-06-24 NOTE — Telephone Encounter (Signed)
Enrique Sack from [facility] called and said that they need the procedure code ankle . The pt scan is happening in about 3 hrs . Please give the pt a call

## 2023-06-27 ENCOUNTER — Ambulatory Visit: Payer: 59 | Admitting: Family Medicine

## 2023-07-05 ENCOUNTER — Ambulatory Visit: Payer: 59 | Admitting: Family Medicine

## 2023-07-05 VITALS — BP 120/72 | HR 65 | Temp 97.8°F | Ht 68.0 in | Wt 157.4 lb

## 2023-07-05 DIAGNOSIS — L239 Allergic contact dermatitis, unspecified cause: Secondary | ICD-10-CM | POA: Diagnosis not present

## 2023-07-05 NOTE — Assessment & Plan Note (Signed)
The rash over the site of the prior ankle surgery looks much improved. I still feel this was an allergic reaction, likely to Neosporin. I recommend he no longer apply a dressing. He can continue the mometasone cream for an additional 1 week. We discussed that the small fluid collection noted over the lateral malleolus likely represents irritation from movement over his plate and screws. He may need to have these removed at a future date.

## 2023-07-05 NOTE — Progress Notes (Signed)
[facility] [address] [place] [address] Dept: [phone] Dept Fax: [phone]  Office Visit  Subjective:    Patient ID: Richard Klein, male    DOB: April 15, 1981, 42 y.o..   MRN: [id number]  Chief Complaint  Patient presents with   Follow-up    2 week f/u.  Foot feeling better but still slow to heal.    History of Present Illness:  Patient is in today for reassessment of his right ankle rash. I saw Mr. Scala 2 weeks ago. He had a concern for a nonhealing surgical wound. Mr. Halas underwent ORIF of a trimalleolar fracture of his right ankle in July. He had a cast in place initially. He felt he had delayed healing of the wound since that time.  He was also applying Neosporin to the site. He started applying a combination dressing but developed small fluid-filled bumps and itching, which became more diffuse itching over time. He stopped using the Neomycin 3 days prior to the last appointment and did have improvement in his itching. He saw the PA with his surgeon a few weeks prior to the last visit who was concerned that the serous drainage he has had might represent infection and prescribed a 10-day course of cephalexin.   On my exam, I felt he was having an allergic reaction to the Neosporin. I saw no evidence of delayed wound healing. I had him stop use of other topicals. I had him start applying mometasone cream once a day. He admits that the wound does appear to be doing better than previously. He had worries about using the steroid cream, as he read in the side effects that its use could lead to death. Despite this, he has been using the cream. He has continued to keep a dressing over this.  Past Medical History: Patient Active Problem List   Diagnosis Date Noted   Allergic dermatitis 06/21/2023   Status post open reduction with internal fixation (ORIF) of fracture of ankle 06/21/2023   Hyperlipidemia 02/22/2023    Allergic rhinitis 02/22/2023   Gilbert's disease 02/22/2023   Past Surgical History:  Procedure Laterality Date   FRACTURE SURGERY  02/25/2023   ORIF for ankle   Family History  Problem Relation Age of Onset   Diabetes Father    Hyperlipidemia Father    ADD / ADHD Daughter    Anxiety disorder Daughter    Diabetes Maternal Uncle    Cancer Maternal Grandmother        Breast (?)   Outpatient Medications Prior to Visit  Medication Sig Dispense Refill   mometasone (ELOCON) 0.1 % cream Apply a small amount to affected area daily. 45 g 1   rosuvastatin (CRESTOR) 10 MG tablet Take 10 mg by mouth at bedtime.     cephALEXin (KEFLEX) 500 MG capsule Take 500 mg by mouth 4 (four) times daily.     No facility-administered medications prior to visit.   Allergies  Allergen Reactions   Neosporin [Bacitracin-Polymyxin B] Rash     Objective:   Today's Vitals   07/05/23 1255  BP: 120/72  Pulse: 65  Temp: 97.8 F (36.6 C)  TempSrc: Temporal  SpO2: 98%  Weight: 157 lb 6.4 oz (71.4 kg)  Height: 5\' 8"  (1.727 m)   Body mass index is 23.93 kg/m.   General: Well developed, well nourished. No acute distress. Skin: Warm and dry. There is a marked reduction in the thickening of the skin and the weeping rash  that was previously present. The surgical wound has healed well. There eis some mild scarrign present formt he previous allergic reaction.   Psych: Alert and oriented. Normal mood and affect.  Health Maintenance Due  Topic Date Due   HIV Screening  Never done   Lab Results    Latest Ref Rng & Units 06/21/2023   11:50 AM  CBC  WBC 4.0 - 10.5 K/uL 7.7   Hemoglobin 13.0 - 17.0 g/dL 84.1   Hematocrit 66.0 - 52.0 % 45.9   Platelets 150.0 - 400.0 K/uL 206.0    Lab Results  Component Value Date   ESRSEDRATE 11 06/21/2023   Lab Results  Component Value Date   CRP <1.0 06/21/2023   Imaging: US Soft Tissue of Lower Extremity (06/24/2023) IMPRESSION: 1. Small fluid collection seen  about the lateral malleolus, nonspecific. Developing abscess not excluded in the appropriate clinical setting.    Assessment & Plan:   Problem List Items Addressed This Visit       Musculoskeletal and Integument   Allergic dermatitis - Primary   The rash over the site of the prior ankle surgery looks much improved. I still feel this was an allergic reaction, likely to Neosporin. I recommend he no longer apply a dressing. He can continue the mometasone cream for an additional 1 week. We discussed that the small fluid collection noted over the lateral malleolus likely represents irritation from movement over his plate and screws. He may need to have these removed at a future date.       Return if symptoms worsen or fail to improve.   Loyola Mast, MD

## 2024-01-12 ENCOUNTER — Other Ambulatory Visit: Payer: Self-pay | Admitting: Family Medicine

## 2024-01-12 MED ORDER — ROSUVASTATIN CALCIUM 10 MG PO TABS: 10.0000 mg | ORAL_TABLET | Freq: Every day | ORAL | 3 refills | Status: AC

## 2024-01-12 NOTE — Telephone Encounter (Signed)
 Refill request for Rosuvastatin 10 mg  LR hx provider LOV  07/05/23 FOV  04/07/24  Please review and advise.  Thanks. Dm/cma

## 2024-01-12 NOTE — Telephone Encounter (Signed)
 Copied from CRM [id number]. Topic: Clinical - Medication Refill >> Jan 12, 2024 10:23 AM Chiquita SQUIBB wrote: Medication:  rosuvastatin rosuvastatin (CRESTOR) 10 MG tablet   Has the patient contacted their pharmacy? Yes (Agent: If no, request that the patient contact the pharmacy for the refill. If patient does not wish to contact the pharmacy document the reason why and proceed with request.) (Agent: If yes, when and what did the pharmacy advise?)  This is the patient's preferred pharmacy:   [facility] [place], [place] - [address] [place] [address] Phone: [phone] Fax: [phone]  Is this the correct pharmacy for this prescription? Yes If no, delete pharmacy and type the correct one.   Has the prescription been filled recently? No  Is the patient out of the medication? No  Has the patient been seen for an appointment in the last year OR does the patient have an upcoming appointment? Yes  Can we respond through MyChart? Yes  Agent: Please be advised that Rx refills may take up to 3 business days. We ask that you follow-up with your pharmacy.

## 2024-03-25 ENCOUNTER — Encounter: Payer: Self-pay | Admitting: Family Medicine

## 2024-04-04 ENCOUNTER — Encounter: Payer: 59 | Admitting: Family Medicine

## 2024-04-09 ENCOUNTER — Encounter: Payer: Self-pay | Admitting: Family Medicine

## 2024-04-09 ENCOUNTER — Ambulatory Visit: Payer: Self-pay | Admitting: Family Medicine

## 2024-04-09 ENCOUNTER — Ambulatory Visit (INDEPENDENT_AMBULATORY_CARE_PROVIDER_SITE_OTHER): Payer: 59 | Admitting: Family Medicine

## 2024-04-09 ENCOUNTER — Encounter: Payer: 59 | Admitting: Family Medicine

## 2024-04-09 VITALS — BP 116/70 | HR 64 | Temp 97.1°F | Ht 68.0 in | Wt 167.8 lb

## 2024-04-09 DIAGNOSIS — K7689 Other specified diseases of liver: Secondary | ICD-10-CM | POA: Insufficient documentation

## 2024-04-09 DIAGNOSIS — Z0000 Encounter for general adult medical examination without abnormal findings: Secondary | ICD-10-CM | POA: Diagnosis not present

## 2024-04-09 DIAGNOSIS — E782 Mixed hyperlipidemia: Secondary | ICD-10-CM | POA: Diagnosis not present

## 2024-04-09 LAB — COMPREHENSIVE METABOLIC PANEL WITH GFR
ALT: 28 U/L (ref 0–53)
AST: 19 U/L (ref 0–37)
Albumin: 4.5 g/dL (ref 3.5–5.2)
Alkaline Phosphatase: 47 U/L (ref 39–117)
BUN: 17 mg/dL (ref 6–23)
CO2: 29 meq/L (ref 19–32)
Calcium: 9.9 mg/dL (ref 8.4–10.5)
Chloride: 104 meq/L (ref 96–112)
Creatinine, Ser: 1.03 mg/dL (ref 0.40–1.50)
GFR: 88.57 mL/min (ref >60.00)
Glucose, Bld: 100 mg/dL — ABNORMAL HIGH (ref 70–99)
Potassium: 3.8 meq/L (ref 3.5–5.1)
Sodium: 139 meq/L (ref 135–145)
Total Bilirubin: 1.7 mg/dL — ABNORMAL HIGH (ref 0.2–1.2)
Total Protein: 7.1 g/dL (ref 6.0–8.3)

## 2024-04-09 LAB — LIPID PANEL
Cholesterol: 142 mg/dL (ref 0–200)
HDL: 46.4 mg/dL (ref >39.00)
LDL Cholesterol: 75 mg/dL (ref 0–99)
NonHDL: 95.14
Total CHOL/HDL Ratio: 3
Triglycerides: 101 mg/dL (ref 0.0–149.0)
VLDL: 20.2 mg/dL (ref 0.0–40.0)

## 2024-04-09 NOTE — Assessment & Plan Note (Signed)
 Overall health is excellent. Recommend regular CV exercise. Discussed recommended screenings and immunizations.

## 2024-04-09 NOTE — Progress Notes (Signed)
 [facility] [address] [place] [address] Dept: [phone] Dept Fax: [phone]  Annual Physical Visit  Subjective:    Patient ID: Richard Klein, male    DOB: Oct 10, 1980, 43 y.o..   MRN: [id number]  Chief Complaint  Patient presents with   Annual Exam    CPE/labs.  Fasting today.  No concerns.     History of Present Illness:  Patient is in today for an annual physical/preventative visit.  Mr. Gust has a history of hyperlipidemia. He is managed on rosuvastatin  10 mg daily.  Review of Systems  Constitutional:  Negative for chills, diaphoresis, fever, malaise/fatigue and weight loss.       Has noted some weight gain over the past year. He has had a position change at work, so has less time for regular exercise. When he does exercise, it has been more often weight training rather than cardio.  HENT:  Positive for tinnitus. Negative for congestion, ear pain, hearing loss, sinus pain and sore throat.        Intermittent, brief episodes of ear ringing.  Eyes:  Negative for blurred vision, pain, discharge and redness.  Respiratory:  Negative for cough, shortness of breath and wheezing.   Cardiovascular:  Negative for chest pain and palpitations.  Gastrointestinal:  Positive for abdominal pain. Negative for constipation, diarrhea, heartburn, nausea and vomiting.       Has brief twinges of pain in the RUQ. Notes a distant history of a cyst on his liver. He was told this was benign in the past. Pain not associated with fatty meals.  Musculoskeletal:  Positive for joint pain. Negative for back pain and myalgias.       Occasional pain in right ankle (prior bimalleolar fracture) with activity that can last a day or two.  Skin:  Negative for itching and rash.  Psychiatric/Behavioral:  Negative for depression. The patient is not nervous/anxious.    Past Medical History: Patient Active Problem List   Diagnosis Date Noted    Hyperlipidemia 02/22/2023   Allergic rhinitis 02/22/2023   Gilbert's disease 02/22/2023   Past Surgical History:  Procedure Laterality Date   FRACTURE SURGERY  02/25/2023   ORIF for ankle   Family History  Problem Relation Age of Onset   Diabetes Father    Hyperlipidemia Father    ADD / ADHD Daughter    Anxiety disorder Daughter    Diabetes Maternal Uncle    Cancer Maternal Grandmother        Breast (?)   Outpatient Medications Prior to Visit  Medication Sig Dispense Refill   mometasone  (ELOCON ) 0.1 % cream Apply a small amount to affected area daily. 45 g 1   rosuvastatin  (CRESTOR ) 10 MG tablet Take 1 tablet (10 mg total) by mouth at bedtime. 90 tablet 3   No facility-administered medications prior to visit.   Allergies  Allergen Reactions   Neosporin [Bacitracin-Polymyxin B] Rash   Objective:   Today's Vitals   04/09/24 0812  BP: 116/70  Pulse: 64  Temp: (!) 97.1 F (36.2 C)  TempSrc: Temporal  SpO2: 99%  Weight: 167 lb 12.8 oz (76.1 kg)  Height: 5' 8 (1.727 m)   Body mass index is 25.51 kg/m.   General: Well developed, well nourished. No acute distress. HEENT: Normocephalic, non-traumatic. PERRL, EOMI. Conjunctiva clear. External ears   normal. EAC and TMs normal bilaterally. Nose clear without congestion or rhinorrhea.   Mucous membranes moist. mild mucous streaking of posterior oropharynx.  Good dentition. Neck: Supple. No lymphadenopathy. No thyromegaly. Lungs: Clear to auscultation bilaterally. No wheezing, rales or rhonchi. CV: RRR without murmurs or rubs. Pulses 2+ bilaterally. Abdomen: Soft, non-tender. Bowel sounds positive, normal pitch and frequency. No   hepatosplenomegaly. No rebound or guarding. Back: Straight. No CVA tenderness bilaterally. Extremities: Full ROM. No joint swelling or tenderness. No edema noted. Skin: Warm and dry. No rashes. Few benign freckles on sole of feet. Minor nail changes   suggestive of onychomycosis. Psych: Alert and  oriented. Normal mood and affect.  Health Maintenance Due  Topic Date Due   HIV Screening  Never done   Hepatitis B Vaccines 19-59 Average Risk (1 of 3 - 19+ 3-dose series) Never done   HPV VACCINES (1 - 3-dose SCDM series) Never done     Assessment & Plan:   Problem List Items Addressed This Visit       Other   Annual physical exam - Primary   Overall health is excellent. Recommend regular CV exercise. Discussed recommended screenings and immunizations.       Hyperlipidemia   I will reassess lipids today. Continue rosuvastatin  10 mg daily.      Relevant Orders   Lipid panel   Comprehensive metabolic panel with GFR    Return in about 1 year (around 04/09/2025) for Annual preventative care.   Garnette CHRISTELLA Simpler, MD

## 2024-04-09 NOTE — Assessment & Plan Note (Signed)
 I will reassess lipids today. Continue rosuvastatin 10 mg daily.

## 2024-05-07 ENCOUNTER — Other Ambulatory Visit: Payer: Self-pay | Admitting: Medical Genetics

## 2024-05-07 DIAGNOSIS — Z006 Encounter for examination for normal comparison and control in clinical research program: Secondary | ICD-10-CM

## 2024-06-12 LAB — GENECONNECT MOLECULAR SCREEN

## 2024-06-13 ENCOUNTER — Telehealth: Payer: Self-pay | Admitting: Medical Genetics

## 2024-06-13 DIAGNOSIS — Z006 Encounter for examination for normal comparison and control in clinical research program: Secondary | ICD-10-CM

## 2024-06-13 NOTE — Telephone Encounter (Signed)
 [facility]  06/13/2024 1:53 PM  Confirmed I was speaking with Richard Klein [phone] by using name and DOB. Informed participant the reason for this call is to follow-up on a recent sample the participant provided at one of the [facility] lab locations. Informed participant the test was not able to be completed with this sample and apologized for the inconvenience. Participant was requested to provide a new sample at one of our participating labs at no cost so that participant can continue participation and receive test results. Informed participant they do not need to be fasting and if there are other samples that need to be drawn, they can be done at the same visit. Participant has not had a blood transfusion or blood product in the last 30 days. Participant agreed to provide another sample. Participant was provided the [employer] program website to learn why this may have happened. Participant was thanked for their time and continued support of the above study.    Jordyn Pennstrom, BS [place]  [facility] Clinical Research Specialist II Direct Dial: [phone]  Fax: [phone]

## 2024-07-29 LAB — GENECONNECT MOLECULAR SCREEN: Genetic Analysis Overall Interpretation: NEGATIVE

## 2025-04-11 ENCOUNTER — Encounter: Admitting: Family Medicine
